# Patient Record
Sex: Female | Born: 1940 | Race: White | Hispanic: No | Smoking: Former smoker
Health system: Southern US, Community
[De-identification: ages and names within clinical notes are randomized; demographics above are authoritative.]

## PROBLEM LIST (undated history)

## (undated) DIAGNOSIS — M8580 Other specified disorders of bone density and structure, unspecified site: Secondary | ICD-10-CM

## (undated) DIAGNOSIS — I10 Essential (primary) hypertension: Secondary | ICD-10-CM

## (undated) DIAGNOSIS — R011 Cardiac murmur, unspecified: Secondary | ICD-10-CM

## (undated) DIAGNOSIS — R569 Unspecified convulsions: Secondary | ICD-10-CM

## (undated) DIAGNOSIS — E039 Hypothyroidism, unspecified: Secondary | ICD-10-CM

## (undated) DIAGNOSIS — F1010 Alcohol abuse, uncomplicated: Secondary | ICD-10-CM

## (undated) DIAGNOSIS — I639 Cerebral infarction, unspecified: Secondary | ICD-10-CM

## (undated) DIAGNOSIS — K8020 Calculus of gallbladder without cholecystitis without obstruction: Secondary | ICD-10-CM

## (undated) DIAGNOSIS — M1990 Unspecified osteoarthritis, unspecified site: Secondary | ICD-10-CM

## (undated) DIAGNOSIS — E785 Hyperlipidemia, unspecified: Secondary | ICD-10-CM

## (undated) DIAGNOSIS — I350 Nonrheumatic aortic (valve) stenosis: Secondary | ICD-10-CM

## (undated) DIAGNOSIS — K635 Polyp of colon: Secondary | ICD-10-CM

## (undated) HISTORY — PX: COLONOSCOPY: SHX174

## (undated) HISTORY — PX: OTHER SURGICAL HISTORY: SHX169

## (undated) HISTORY — DX: Hyperlipidemia, unspecified: E78.5

## (undated) HISTORY — DX: Cerebral infarction, unspecified: I63.9

## (undated) HISTORY — DX: Unspecified convulsions: R56.9

## (undated) HISTORY — DX: Hypothyroidism, unspecified: E03.9

## (undated) HISTORY — DX: Nonrheumatic aortic (valve) stenosis: I35.0

## (undated) HISTORY — DX: Other specified disorders of bone density and structure, unspecified site: M85.80

## (undated) HISTORY — DX: Alcohol abuse, uncomplicated: F10.10

## (undated) HISTORY — DX: Calculus of gallbladder without cholecystitis without obstruction: K80.20

## (undated) HISTORY — DX: Polyp of colon: K63.5

## (undated) HISTORY — DX: Essential (primary) hypertension: I10

---

## 1997-07-11 ENCOUNTER — Other Ambulatory Visit: Admission: RE | Admit: 1997-07-11 | Discharge: 1997-07-11 | Payer: Self-pay | Admitting: Obstetrics & Gynecology

## 1999-06-21 ENCOUNTER — Ambulatory Visit (HOSPITAL_COMMUNITY): Admission: RE | Admit: 1999-06-21 | Discharge: 1999-06-21 | Payer: Self-pay | Admitting: Gastroenterology

## 1999-11-16 ENCOUNTER — Encounter: Payer: Self-pay | Admitting: Family Medicine

## 1999-11-16 ENCOUNTER — Encounter: Admission: RE | Admit: 1999-11-16 | Discharge: 1999-11-16 | Payer: Self-pay | Admitting: Family Medicine

## 2000-11-29 ENCOUNTER — Encounter: Payer: Self-pay | Admitting: Family Medicine

## 2000-11-29 ENCOUNTER — Ambulatory Visit (HOSPITAL_COMMUNITY): Admission: RE | Admit: 2000-11-29 | Discharge: 2000-11-29 | Payer: Self-pay | Admitting: Family Medicine

## 2000-12-21 ENCOUNTER — Other Ambulatory Visit: Admission: RE | Admit: 2000-12-21 | Discharge: 2000-12-21 | Payer: Self-pay | Admitting: Internal Medicine

## 2002-03-20 ENCOUNTER — Other Ambulatory Visit: Admission: RE | Admit: 2002-03-20 | Discharge: 2002-03-20 | Payer: Self-pay | Admitting: Family Medicine

## 2002-04-02 ENCOUNTER — Encounter: Admission: RE | Admit: 2002-04-02 | Discharge: 2002-04-02 | Payer: Self-pay | Admitting: Family Medicine

## 2002-04-02 ENCOUNTER — Encounter: Payer: Self-pay | Admitting: Family Medicine

## 2003-11-11 ENCOUNTER — Other Ambulatory Visit: Admission: RE | Admit: 2003-11-11 | Discharge: 2003-11-11 | Payer: Self-pay | Admitting: Family Medicine

## 2003-11-18 ENCOUNTER — Ambulatory Visit (HOSPITAL_COMMUNITY): Admission: RE | Admit: 2003-11-18 | Discharge: 2003-11-18 | Payer: Self-pay | Admitting: Family Medicine

## 2003-11-21 ENCOUNTER — Ambulatory Visit (HOSPITAL_COMMUNITY): Admission: RE | Admit: 2003-11-21 | Discharge: 2003-11-21 | Payer: Self-pay | Admitting: Family Medicine

## 2005-08-25 ENCOUNTER — Encounter: Admission: RE | Admit: 2005-08-25 | Discharge: 2005-08-25 | Payer: Self-pay | Admitting: Family Medicine

## 2006-01-12 ENCOUNTER — Ambulatory Visit: Admission: RE | Admit: 2006-01-12 | Discharge: 2006-01-12 | Payer: Self-pay | Admitting: Family Medicine

## 2006-01-12 ENCOUNTER — Encounter: Payer: Self-pay | Admitting: Vascular Surgery

## 2007-05-31 ENCOUNTER — Encounter: Admission: RE | Admit: 2007-05-31 | Discharge: 2007-05-31 | Payer: Self-pay | Admitting: Family Medicine

## 2008-06-02 ENCOUNTER — Encounter: Admission: RE | Admit: 2008-06-02 | Discharge: 2008-06-02 | Payer: Self-pay | Admitting: Family Medicine

## 2010-04-25 ENCOUNTER — Encounter: Payer: Self-pay | Admitting: Family Medicine

## 2010-10-07 ENCOUNTER — Other Ambulatory Visit: Payer: Self-pay | Admitting: Family Medicine

## 2010-10-07 DIAGNOSIS — Z1231 Encounter for screening mammogram for malignant neoplasm of breast: Secondary | ICD-10-CM

## 2010-10-07 DIAGNOSIS — M8580 Other specified disorders of bone density and structure, unspecified site: Secondary | ICD-10-CM

## 2010-10-20 ENCOUNTER — Ambulatory Visit
Admission: RE | Admit: 2010-10-20 | Discharge: 2010-10-20 | Disposition: A | Discharge status: Home / Self Care | Payer: 59 | Source: Ambulatory Visit | Attending: Family Medicine | Admitting: Family Medicine

## 2010-10-20 DIAGNOSIS — M8580 Other specified disorders of bone density and structure, unspecified site: Secondary | ICD-10-CM

## 2010-10-20 DIAGNOSIS — Z1231 Encounter for screening mammogram for malignant neoplasm of breast: Secondary | ICD-10-CM

## 2013-10-01 ENCOUNTER — Other Ambulatory Visit: Payer: Self-pay | Admitting: Family Medicine

## 2013-10-01 DIAGNOSIS — M8580 Other specified disorders of bone density and structure, unspecified site: Secondary | ICD-10-CM

## 2013-10-01 DIAGNOSIS — Z1231 Encounter for screening mammogram for malignant neoplasm of breast: Secondary | ICD-10-CM

## 2013-10-03 ENCOUNTER — Ambulatory Visit
Admission: RE | Admit: 2013-10-03 | Discharge: 2013-10-03 | Disposition: A | Discharge status: Home / Self Care | Payer: Medicare Other | Source: Ambulatory Visit | Attending: Family Medicine | Admitting: Family Medicine

## 2013-10-03 DIAGNOSIS — M8580 Other specified disorders of bone density and structure, unspecified site: Secondary | ICD-10-CM

## 2013-10-03 DIAGNOSIS — Z1231 Encounter for screening mammogram for malignant neoplasm of breast: Secondary | ICD-10-CM

## 2015-07-23 ENCOUNTER — Other Ambulatory Visit: Payer: Self-pay | Admitting: Family Medicine

## 2015-07-23 DIAGNOSIS — R5381 Other malaise: Secondary | ICD-10-CM

## 2015-07-29 ENCOUNTER — Other Ambulatory Visit: Payer: Self-pay | Admitting: Family Medicine

## 2015-07-29 DIAGNOSIS — M8580 Other specified disorders of bone density and structure, unspecified site: Secondary | ICD-10-CM

## 2015-07-29 DIAGNOSIS — Z1231 Encounter for screening mammogram for malignant neoplasm of breast: Secondary | ICD-10-CM

## 2015-10-07 ENCOUNTER — Ambulatory Visit
Admission: RE | Admit: 2015-10-07 | Discharge: 2015-10-07 | Disposition: A | Discharge status: Home / Self Care | Payer: Medicare Other | Source: Ambulatory Visit | Attending: Family Medicine | Admitting: Family Medicine

## 2015-10-07 DIAGNOSIS — Z1231 Encounter for screening mammogram for malignant neoplasm of breast: Secondary | ICD-10-CM

## 2015-10-07 DIAGNOSIS — M8580 Other specified disorders of bone density and structure, unspecified site: Secondary | ICD-10-CM

## 2016-09-18 NOTE — H&P (Signed)
TOTAL HIP ADMISSION H&P  Patient is admitted for right total hip arthroplasty, anterior approach.  Subjective:  Chief Complaint:    Right hip primary OA / pain  HPI: Sarah Berg, 76 y.o. female, has a history of pain and functional disability in the right hip(s) due to arthritis and patient has failed non-surgical conservative treatments for greater than 12 weeks to include NSAID's and/or analgesics, use of assistive devices and activity modification.  Onset of symptoms was gradual starting >10 years ago with gradually worsening course since that time.The patient noted no past surgery on the right hip(s).  Patient currently rates pain in the right hip at 5 out of 10 with activity. Patient has worsening of pain with activity and weight bearing, trendelenberg gait, pain that interfers with activities of daily living and pain with passive range of motion. Patient has evidence of periarticular osteophytes and joint space narrowing by imaging studies. This condition presents safety issues increasing the risk of falls.  There is no current active infection.   Risks, benefits and expectations were discussed with the patient.  Risks including but not limited to the risk of anesthesia, blood clots, nerve damage, blood vessel damage, failure of the prosthesis, infection and up to and including death.  Patient understand the risks, benefits and expectations and wishes to proceed with surgery.   PCP: Patient, No Pcp Per  D/C Plans:       Home - Daughter's home  Post-op Meds:       No Rx given  Tranexamic Acid:      To be given - IV   Decadron:      Is to be given  FYI:     ASA  Norco  DME:   Rx given for - RW and 3-n-1  PT:   No PT   Past Medical History:  Diagnosis Date  . Alcohol abuse   . Aortic stenosis   . CVA (cerebrovascular accident)   . Gallstones   . Hyperlipidemia   . Hyperplastic colon polyp   . Hypertension   . Hypothyroidism   . Osteopenia   . Peripheral vascular disease    . Seizures     Past Surgical History:  Procedure Laterality Date  . COLONOSCOPY    . hyperplastic      No prescriptions prior to admission.   Allergies  Allergen Reactions  . Ampicillin     Social History  Substance Use Topics  . Smoking status: Current Every Day Smoker    Packs/day: 1.50    Types: Cigarettes  . Smokeless tobacco: Not on file  . Alcohol use No    Family History  Problem Relation Age of Onset  . Heart disease Mother   . Colon cancer Father   . Esophageal cancer Sister   . Colon cancer Brother   . Colon cancer Paternal Grandfather      Review of Systems  Constitutional: Negative.   HENT: Negative.   Eyes: Negative.   Respiratory: Negative.   Cardiovascular: Negative.   Gastrointestinal: Negative.   Genitourinary: Negative.   Musculoskeletal: Positive for joint pain.  Skin: Negative.   Neurological: Negative.   Endo/Heme/Allergies: Negative.   Psychiatric/Behavioral: Negative.     Objective:  Physical Exam  Constitutional: She is oriented to person, place, and time. She appears well-developed.  HENT:  Head: Normocephalic.  Eyes: Pupils are equal, round, and reactive to light.  Neck: Neck supple. No JVD present. No tracheal deviation present. No thyromegaly present.  Cardiovascular:  Normal rate, regular rhythm and intact distal pulses.   Murmur heard. Respiratory: Effort normal and breath sounds normal. No respiratory distress. She has no wheezes.  GI: Soft. There is no tenderness. There is no guarding.  Musculoskeletal:       Right hip: She exhibits decreased range of motion, decreased strength, tenderness and bony tenderness. She exhibits no swelling, no deformity and no laceration.  Lymphadenopathy:    She has no cervical adenopathy.  Neurological: She is alert and oriented to person, place, and time.  Skin: Skin is warm and dry.  Psychiatric: She has a normal mood and affect.      Imaging Review Plain radiographs demonstrate  severe degenerative joint disease of the right hip(s). The bone quality appears to be good for age and reported activity level.  Assessment/Plan:  End stage arthritis, right hip(s)  The patient history, physical examination, clinical judgement of the provider and imaging studies are consistent with end stage degenerative joint disease of the right hip(s) and total hip arthroplasty is deemed medically necessary. The treatment options including medical management, injection therapy, arthroscopy and arthroplasty were discussed at length. The risks and benefits of total hip arthroplasty were presented and reviewed. The risks due to aseptic loosening, infection, stiffness, dislocation/subluxation,  thromboembolic complications and other imponderables were discussed.  The patient acknowledged the explanation, agreed to proceed with the plan and consent was signed. Patient is being admitted for inpatient treatment for surgery, pain control, PT, OT, prophylactic antibiotics, VTE prophylaxis, progressive ambulation and ADL's and discharge planning.The patient is planning to be discharged home.      Anastasio AuerbachMatthew S. Mihira Tozzi   PA-C  09/18/2016, 10:32 AM

## 2016-09-21 ENCOUNTER — Encounter (HOSPITAL_COMMUNITY): Payer: Self-pay

## 2016-09-21 NOTE — Patient Instructions (Signed)
Kateri PlummerJanice E Soltis  09/21/2016   Your procedure is scheduled on: 09/27/2016    [facility]   Report to admitting at   1030 AM   Call this number if you have problems the morning of surgery  [phone]   Remember: ONLY 1 PERSON MAY GO WITH YOU TO SHORT STAY TO GET  READY MORNING OF YOUR SURGERY.  Do not eat food or drink liquids :After Midnight.     Take these medicines the morning of surgery with A SIP OF WATER:Flonase if neededm, Synthroid                                  You may not have any metal on your body including hair pins and              piercings  Do not wear jewelry, make-up, lotions, powders or perfumes, deodorant             Do not wear nail polish.  Do not shave  48 hours prior to surgery.                Do not bring valuables to the hospital. [facility] IS NOT             RESPONSIBLE   FOR VALUABLES.  Contacts, dentures or bridgework may not be worn into surgery.  Leave suitcase in the car. After surgery it may be brought to your room.                     Please read over the following fact sheets you were given: _____________________________________________________________________             [facility] - Preparing [facility] Before surgery, you can play an important role.  Because skin is not sterile, your skin needs to be as free of germs as possible.  You can reduce the number of germs on your skin by washing with CHG (chlorahexidine gluconate) soap before surgery.  CHG is an antiseptic cleaner which kills germs and bonds with the skin to continue killing germs even after washing. Please DO NOT use if you have an allergy to CHG or antibacterial soaps.  If your skin becomes reddened/irritated stop using the CHG and inform your nurse when you arrive at Short Stay. Do not shave (including legs and underarms) for at least 48 hours prior to the first CHG shower.  You may shave your face/neck. Please follow  these instructions carefully:  1.  Shower with CHG Soap the night before surgery and the  morning of Surgery.  2.  If you choose to wash your hair, wash your hair first as usual with your  normal  shampoo.  3.  After you shampoo, rinse your hair and body thoroughly to remove the  shampoo.                           4.  Use CHG as you would any other liquid soap.  You can apply chg directly  to the skin and wash                       Gently with a scrungie or clean washcloth.  5.  Apply the CHG  Soap to your body ONLY FROM THE NECK DOWN.   Do not use on face/ open                           Wound or open sores. Avoid contact with eyes, ears mouth and genitals (private parts).                       Wash face,  Genitals (private parts) with your normal soap.             6.  Wash thoroughly, paying special attention to the area where your surgery  will be performed.  7.  Thoroughly rinse your body with warm water from the neck down.  8.  DO NOT shower/wash with your normal soap after using and rinsing off  the CHG Soap.                9.  Pat yourself dry with a clean towel.            10.  Wear clean pajamas.            11.  Place clean sheets on your bed the night of your first shower and do not  sleep with pets. Day of Surgery : Do not apply any lotions/deodorants the morning of surgery.  Please wear clean clothes to the hospital/surgery center.  FAILURE TO FOLLOW THESE INSTRUCTIONS MAY RESULT IN THE CANCELLATION OF YOUR SURGERY PATIENT SIGNATURE_________________________________  NURSE SIGNATURE__________________________________  ________________________________________________________________________  WHAT IS A BLOOD TRANSFUSION? Blood Transfusion Information  A transfusion is the replacement of blood or some of its parts. Blood is made up of multiple cells which provide different functions.  Red blood cells carry oxygen and are used for blood loss replacement.  White blood cells fight  against infection.  Platelets control bleeding.  Plasma helps clot blood.  Other blood products are available for specialized needs, such as hemophilia or other clotting disorders. BEFORE THE TRANSFUSION  Who gives blood for transfusions?   Healthy volunteers who are fully evaluated to make sure their blood is safe. This is blood bank blood. Transfusion therapy is the safest it has ever been in the practice of medicine. Before blood is taken from a donor, a complete history is taken to make sure that person has no history of diseases nor engages in risky social behavior (examples are intravenous drug use or sexual activity with multiple partners). The donor's travel history is screened to minimize risk of transmitting infections, such as malaria. The donated blood is tested for signs of infectious diseases, such as HIV and hepatitis. The blood is then tested to be sure it is compatible with you in order to minimize the chance of a transfusion reaction. If you or a relative donates blood, this is often done in anticipation of surgery and is not appropriate for emergency situations. It takes many days to process the donated blood. RISKS AND COMPLICATIONS Although transfusion therapy is very safe and saves many lives, the main dangers of transfusion include:   Getting an infectious disease.  Developing a transfusion reaction. This is an allergic reaction to something in the blood you were given. Every precaution is taken to prevent this. The decision to have a blood transfusion has been considered carefully by your caregiver before blood is given. Blood is not given unless the benefits outweigh the risks. AFTER THE TRANSFUSION  Right after receiving a blood transfusion, you  will usually feel much better and more energetic. This is especially true if your red blood cells have gotten low (anemic). The transfusion raises the level of the red blood cells which carry oxygen, and this usually causes an  energy increase.  The nurse administering the transfusion will monitor you carefully for complications. HOME CARE INSTRUCTIONS  No special instructions are needed after a transfusion. You may find your energy is better. Speak with your caregiver about any limitations on activity for underlying diseases you may have. SEEK MEDICAL CARE IF:   Your condition is not improving after your transfusion.  You develop redness or irritation at the intravenous (IV) site. SEEK IMMEDIATE MEDICAL CARE IF:  Any of the following symptoms occur over the next 12 hours:  Shaking chills.  You have a temperature by mouth above 102 F (38.9 C), not controlled by medicine.  Chest, back, or muscle pain.  People around you feel you are not acting correctly or are confused.  Shortness of breath or difficulty breathing.  Dizziness and fainting.  You get a rash or develop hives.  You have a decrease in urine output.  Your urine turns a dark color or changes to pink, red, or brown. Any of the following symptoms occur over the next 10 days:  You have a temperature by mouth above 102 F (38.9 C), not controlled by medicine.  Shortness of breath.  Weakness after normal activity.  The white part of the eye turns yellow (jaundice).  You have a decrease in the amount of urine or are urinating less often.  Your urine turns a dark color or changes to pink, red, or brown. Document Released: 03/18/2000 Document Revised: 06/13/2011 Document Reviewed: 11/05/2007 ExitCare Patient Information 2014 [place].  _______________________________________________________________________  Incentive Spirometer  An incentive spirometer is a tool that can help keep your lungs clear and active. This tool measures how well you are filling your lungs with each breath. Taking long deep breaths may help reverse or decrease the chance of developing breathing (pulmonary) problems (especially infection) following:  A  long period of time when you are unable to move or be active. BEFORE THE PROCEDURE   If the spirometer includes an indicator to show your best effort, your nurse or respiratory therapist will set it to a desired goal.  If possible, sit up straight or lean slightly forward. Try not to slouch.  Hold the incentive spirometer in an upright position. INSTRUCTIONS FOR USE  1. Sit on the edge of your bed if possible, or sit up as far as you can in bed or on a chair. 2. Hold the incentive spirometer in an upright position. 3. Breathe out normally. 4. Place the mouthpiece in your mouth and seal your lips tightly around it. 5. Breathe in slowly and as deeply as possible, raising the piston or the ball toward the top of the column. 6. Hold your breath for 3-5 seconds or for as long as possible. Allow the piston or ball to fall to the bottom of the column. 7. Remove the mouthpiece from your mouth and breathe out normally. 8. Rest for a few seconds and repeat Steps 1 through 7 at least 10 times every 1-2 hours when you are awake. Take your time and take a few normal breaths between deep breaths. 9. The spirometer may include an indicator to show your best effort. Use the indicator as a goal to work toward during each repetition. 10. After each set of 10 deep breaths, practice  coughing to be sure your lungs are clear. If you have an incision (the cut made at the time of surgery), support your incision when coughing by placing a pillow or rolled up towels firmly against it. Once you are able to get out of bed, walk around indoors and cough well. You may stop using the incentive spirometer when instructed by your caregiver.  RISKS AND COMPLICATIONS  Take your time so you do not get dizzy or light-headed.  If you are in pain, you may need to take or ask for pain medication before doing incentive spirometry. It is harder to take a deep breath if you are having pain. AFTER USE  Rest and breathe slowly and  easily.  It can be helpful to keep track of a log of your progress. Your caregiver can provide you with a simple table to help with this. If you are using the spirometer at home, follow these instructions: [place] IF:   You are having difficultly using the spirometer.  You have trouble using the spirometer as often as instructed.  Your pain medication is not giving enough relief while using the spirometer.  You develop fever of 100.5 F (38.1 C) or higher. SEEK IMMEDIATE MEDICAL CARE IF:   You cough up bloody sputum that had not been present before.  You develop fever of 102 F (38.9 C) or greater.  You develop worsening pain at or near the incision site. MAKE SURE YOU:   Understand these instructions.  Will watch your condition.  Will get help right away if you are not doing well or get worse. Document Released: 08/01/2006 Document Revised: 06/13/2011 Document Reviewed: 10/02/2006 [facility] Patient Information 2014 [place], [place].   ________________________________________________________________________

## 2016-09-22 ENCOUNTER — Encounter (HOSPITAL_COMMUNITY)
Admission: RE | Admit: 2016-09-22 | Discharge: 2016-09-22 | Disposition: A | Discharge status: Home / Self Care | Payer: Medicare Other | Source: Ambulatory Visit | Attending: Orthopedic Surgery | Admitting: Orthopedic Surgery

## 2016-09-22 ENCOUNTER — Encounter (HOSPITAL_COMMUNITY): Payer: Self-pay | Admitting: *Deleted

## 2016-09-22 DIAGNOSIS — M1611 Unilateral primary osteoarthritis, right hip: Secondary | ICD-10-CM | POA: Diagnosis not present

## 2016-09-22 DIAGNOSIS — Z01812 Encounter for preprocedural laboratory examination: Secondary | ICD-10-CM | POA: Insufficient documentation

## 2016-09-22 DIAGNOSIS — Z01810 Encounter for preprocedural cardiovascular examination: Secondary | ICD-10-CM | POA: Diagnosis not present

## 2016-09-22 HISTORY — DX: Unspecified osteoarthritis, unspecified site: M19.90

## 2016-09-22 HISTORY — DX: Cardiac murmur, unspecified: R01.1

## 2016-09-22 LAB — CBC
HEMATOCRIT: 35.6 % — AB (ref 36.0–46.0)
Hemoglobin: 12.1 g/dL (ref 12.0–15.0)
MCH: 30 pg (ref 26.0–34.0)
MCHC: 34 g/dL (ref 30.0–36.0)
MCV: 88.1 fL (ref 78.0–100.0)
Platelets: 283 10*3/uL (ref 150–400)
RBC: 4.04 MIL/uL (ref 3.87–5.11)
RDW: 14.1 % (ref 11.5–15.5)
WBC: 10.2 10*3/uL (ref 4.0–10.5)

## 2016-09-22 LAB — COMPREHENSIVE METABOLIC PANEL
ALT: 14 U/L (ref 14–54)
ANION GAP: 6 (ref 5–15)
AST: 22 U/L (ref 15–41)
Albumin: 4.1 g/dL (ref 3.5–5.0)
Alkaline Phosphatase: 43 U/L (ref 38–126)
BILIRUBIN TOTAL: 0.6 mg/dL (ref 0.3–1.2)
BUN: 15 mg/dL (ref 6–20)
CALCIUM: 9.3 mg/dL (ref 8.9–10.3)
CO2: 28 mmol/L (ref 22–32)
Chloride: 103 mmol/L (ref 101–111)
Creatinine, Ser: 0.76 mg/dL (ref 0.44–1.00)
GFR calc Af Amer: >60 mL/min (ref >60)
Glucose, Bld: 94 mg/dL (ref 65–99)
POTASSIUM: 4.7 mmol/L (ref 3.5–5.1)
Sodium: 137 mmol/L (ref 135–145)
TOTAL PROTEIN: 7.2 g/dL (ref 6.5–8.1)

## 2016-09-22 LAB — SURGICAL PCR SCREEN
MRSA, PCR: NEGATIVE
STAPHYLOCOCCUS AUREUS: NEGATIVE

## 2016-09-22 LAB — ABO/RH: ABO/RH(D): A POS

## 2016-09-22 NOTE — Progress Notes (Signed)
Clearance- 07/20/16 on chart - Dr Tiburcio Peaharris

## 2016-09-26 NOTE — Progress Notes (Signed)
Final EKG done 09/22/16-in epic  

## 2016-09-27 ENCOUNTER — Encounter (HOSPITAL_COMMUNITY): Payer: Self-pay

## 2016-09-27 ENCOUNTER — Inpatient Hospital Stay (HOSPITAL_COMMUNITY): Payer: Medicare Other | Admitting: Anesthesiology

## 2016-09-27 ENCOUNTER — Inpatient Hospital Stay (HOSPITAL_COMMUNITY): Payer: Medicare Other

## 2016-09-27 ENCOUNTER — Encounter (HOSPITAL_COMMUNITY)
Admission: RE | Disposition: A | Discharge status: Home / Self Care | Payer: Self-pay | Source: Ambulatory Visit | Attending: Orthopedic Surgery

## 2016-09-27 ENCOUNTER — Inpatient Hospital Stay (HOSPITAL_COMMUNITY)
Admission: RE | Admit: 2016-09-27 | Discharge: 2016-09-28 | DRG: 470 | Disposition: A | Discharge status: Home / Self Care | Payer: Medicare Other | Source: Ambulatory Visit | Attending: Orthopedic Surgery | Admitting: Orthopedic Surgery

## 2016-09-27 DIAGNOSIS — I10 Essential (primary) hypertension: Secondary | ICD-10-CM | POA: Diagnosis present

## 2016-09-27 DIAGNOSIS — Z79899 Other long term (current) drug therapy: Secondary | ICD-10-CM

## 2016-09-27 DIAGNOSIS — F17210 Nicotine dependence, cigarettes, uncomplicated: Secondary | ICD-10-CM | POA: Diagnosis present

## 2016-09-27 DIAGNOSIS — Z881 Allergy status to other antibiotic agents status: Secondary | ICD-10-CM | POA: Diagnosis not present

## 2016-09-27 DIAGNOSIS — M25551 Pain in right hip: Secondary | ICD-10-CM | POA: Diagnosis present

## 2016-09-27 DIAGNOSIS — Z8673 Personal history of transient ischemic attack (TIA), and cerebral infarction without residual deficits: Secondary | ICD-10-CM

## 2016-09-27 DIAGNOSIS — I350 Nonrheumatic aortic (valve) stenosis: Secondary | ICD-10-CM | POA: Diagnosis present

## 2016-09-27 DIAGNOSIS — E785 Hyperlipidemia, unspecified: Secondary | ICD-10-CM | POA: Diagnosis present

## 2016-09-27 DIAGNOSIS — M1611 Unilateral primary osteoarthritis, right hip: Secondary | ICD-10-CM | POA: Diagnosis present

## 2016-09-27 DIAGNOSIS — Z96641 Presence of right artificial hip joint: Secondary | ICD-10-CM

## 2016-09-27 DIAGNOSIS — Z96649 Presence of unspecified artificial hip joint: Secondary | ICD-10-CM

## 2016-09-27 HISTORY — PX: TOTAL HIP ARTHROPLASTY: SHX124

## 2016-09-27 LAB — TYPE AND SCREEN
ABO/RH(D): A POS
ANTIBODY SCREEN: NEGATIVE

## 2016-09-27 SURGERY — ARTHROPLASTY, HIP, TOTAL, ANTERIOR APPROACH
Anesthesia: Spinal | Site: Hip | Laterality: Right

## 2016-09-27 MED ORDER — BISACODYL 10 MG RE SUPP: 10.0000 mg | Freq: Every day | RECTAL | Status: DC | PRN

## 2016-09-27 MED ORDER — LACTATED RINGERS IV SOLN
INTRAVENOUS | Status: DC
  Administered 2016-09-27: 1000 mL via INTRAVENOUS
  Administered 2016-09-27 (×2): via INTRAVENOUS

## 2016-09-27 MED ORDER — MEPERIDINE HCL 50 MG/ML IJ SOLN: 6.2500 mg | INTRAMUSCULAR | Status: DC | PRN

## 2016-09-27 MED ORDER — ONDANSETRON HCL 4 MG/2ML IJ SOLN: 4.0000 mg | Freq: Four times a day (QID) | INTRAMUSCULAR | Status: DC | PRN

## 2016-09-27 MED ORDER — FERROUS SULFATE 325 (65 FE) MG PO TABS: 325.0000 mg | ORAL_TABLET | Freq: Three times a day (TID) | ORAL | Status: DC

## 2016-09-27 MED ORDER — LIDOCAINE 2% (20 MG/ML) 5 ML SYRINGE
INTRAMUSCULAR | Status: DC | PRN
  Administered 2016-09-27: 20 mg via INTRAVENOUS

## 2016-09-27 MED ORDER — METHOCARBAMOL 1000 MG/10ML IJ SOLN
500.0000 mg | Freq: Four times a day (QID) | INTRAVENOUS | Status: DC | PRN
  Filled 2016-09-27: qty 5

## 2016-09-27 MED ORDER — MIDAZOLAM HCL 2 MG/2ML IJ SOLN
INTRAMUSCULAR | Status: DC | PRN
  Administered 2016-09-27 (×2): 1 mg via INTRAVENOUS

## 2016-09-27 MED ORDER — CEFAZOLIN SODIUM-DEXTROSE 2-4 GM/100ML-% IV SOLN
2.0000 g | INTRAVENOUS | Status: AC
  Administered 2016-09-27: 2 g via INTRAVENOUS

## 2016-09-27 MED ORDER — FLUTICASONE PROPIONATE 50 MCG/ACT NA SUSP
1.0000 | Freq: Every day | NASAL | Status: DC | PRN
  Filled 2016-09-27: qty 16

## 2016-09-27 MED ORDER — ALUM & MAG HYDROXIDE-SIMETH 200-200-20 MG/5ML PO SUSP: 15.0000 mL | ORAL | Status: DC | PRN

## 2016-09-27 MED ORDER — ONDANSETRON HCL 4 MG/2ML IJ SOLN
INTRAMUSCULAR | Status: AC
  Filled 2016-09-27: qty 2

## 2016-09-27 MED ORDER — PROPOFOL 500 MG/50ML IV EMUL
INTRAVENOUS | Status: DC | PRN
  Administered 2016-09-27: 50 ug/kg/min via INTRAVENOUS

## 2016-09-27 MED ORDER — FENTANYL CITRATE (PF) 100 MCG/2ML IJ SOLN
INTRAMUSCULAR | Status: DC | PRN
  Administered 2016-09-27 (×2): 50 ug via INTRAVENOUS

## 2016-09-27 MED ORDER — OXYCODONE HCL 5 MG PO TABS: 5.0000 mg | ORAL_TABLET | Freq: Once | ORAL | Status: DC | PRN

## 2016-09-27 MED ORDER — PROMETHAZINE HCL 25 MG/ML IJ SOLN: 6.2500 mg | INTRAMUSCULAR | Status: DC | PRN

## 2016-09-27 MED ORDER — CEFAZOLIN SODIUM-DEXTROSE 2-4 GM/100ML-% IV SOLN
INTRAVENOUS | Status: AC
  Filled 2016-09-27: qty 100

## 2016-09-27 MED ORDER — DEXAMETHASONE SODIUM PHOSPHATE 10 MG/ML IJ SOLN
INTRAMUSCULAR | Status: AC
  Filled 2016-09-27: qty 1

## 2016-09-27 MED ORDER — PROPOFOL 10 MG/ML IV BOLUS
INTRAVENOUS | Status: AC
  Filled 2016-09-27: qty 40

## 2016-09-27 MED ORDER — DEXAMETHASONE SODIUM PHOSPHATE 10 MG/ML IJ SOLN
10.0000 mg | Freq: Once | INTRAMUSCULAR | Status: AC
  Administered 2016-09-27: 10 mg via INTRAVENOUS

## 2016-09-27 MED ORDER — CHLORHEXIDINE GLUCONATE 4 % EX LIQD: 60.0000 mL | Freq: Once | CUTANEOUS | Status: DC

## 2016-09-27 MED ORDER — DIPHENHYDRAMINE HCL 25 MG PO CAPS
25.0000 mg | ORAL_CAPSULE | Freq: Four times a day (QID) | ORAL | Status: DC | PRN
  Filled 2016-09-27: qty 1

## 2016-09-27 MED ORDER — DOCUSATE SODIUM 100 MG PO CAPS
100.0000 mg | ORAL_CAPSULE | Freq: Two times a day (BID) | ORAL | Status: DC
  Administered 2016-09-27 – 2016-09-28 (×2): 100 mg via ORAL
  Filled 2016-09-27 (×2): qty 1

## 2016-09-27 MED ORDER — DEXAMETHASONE SODIUM PHOSPHATE 10 MG/ML IJ SOLN: 10.0000 mg | Freq: Once | INTRAMUSCULAR | Status: DC

## 2016-09-27 MED ORDER — PROPOFOL 10 MG/ML IV BOLUS
INTRAVENOUS | Status: AC
  Filled 2016-09-27: qty 20

## 2016-09-27 MED ORDER — OXYCODONE HCL 5 MG/5ML PO SOLN
5.0000 mg | Freq: Once | ORAL | Status: DC | PRN
  Filled 2016-09-27: qty 5

## 2016-09-27 MED ORDER — CELECOXIB 200 MG PO CAPS
200.0000 mg | ORAL_CAPSULE | Freq: Two times a day (BID) | ORAL | Status: DC
  Administered 2016-09-27 – 2016-09-28 (×2): 200 mg via ORAL
  Filled 2016-09-27 (×2): qty 1

## 2016-09-27 MED ORDER — SODIUM CHLORIDE 0.9 % IV SOLN
INTRAVENOUS | Status: DC
  Administered 2016-09-28: via INTRAVENOUS

## 2016-09-27 MED ORDER — CEFAZOLIN SODIUM-DEXTROSE 2-4 GM/100ML-% IV SOLN
2.0000 g | Freq: Four times a day (QID) | INTRAVENOUS | Status: AC
  Administered 2016-09-27 – 2016-09-28 (×2): 2 g via INTRAVENOUS
  Filled 2016-09-27 (×2): qty 100

## 2016-09-27 MED ORDER — FENTANYL CITRATE (PF) 100 MCG/2ML IJ SOLN: 25.0000 ug | INTRAMUSCULAR | Status: DC | PRN

## 2016-09-27 MED ORDER — PROPOFOL 10 MG/ML IV BOLUS
INTRAVENOUS | Status: DC | PRN
  Administered 2016-09-27 (×3): 20 mg via INTRAVENOUS

## 2016-09-27 MED ORDER — MENTHOL 3 MG MT LOZG: 1.0000 | LOZENGE | OROMUCOSAL | Status: DC | PRN

## 2016-09-27 MED ORDER — SODIUM CHLORIDE 0.9 % IR SOLN
Status: DC | PRN
  Administered 2016-09-27: 1000 mL

## 2016-09-27 MED ORDER — MAGNESIUM CITRATE PO SOLN: 1.0000 | Freq: Once | ORAL | Status: DC | PRN

## 2016-09-27 MED ORDER — METHOCARBAMOL 500 MG PO TABS
500.0000 mg | ORAL_TABLET | Freq: Four times a day (QID) | ORAL | Status: DC | PRN
  Administered 2016-09-28: 500 mg via ORAL
  Filled 2016-09-27: qty 1

## 2016-09-27 MED ORDER — FENTANYL CITRATE (PF) 100 MCG/2ML IJ SOLN
INTRAMUSCULAR | Status: AC
  Filled 2016-09-27: qty 2

## 2016-09-27 MED ORDER — TRANEXAMIC ACID 1000 MG/10ML IV SOLN
1000.0000 mg | Freq: Once | INTRAVENOUS | Status: AC
  Administered 2016-09-27: 1000 mg via INTRAVENOUS
  Filled 2016-09-27: qty 1100

## 2016-09-27 MED ORDER — TRANEXAMIC ACID 1000 MG/10ML IV SOLN
1000.0000 mg | INTRAVENOUS | Status: AC
  Administered 2016-09-27: 1000 mg via INTRAVENOUS
  Filled 2016-09-27: qty 1100

## 2016-09-27 MED ORDER — ONDANSETRON HCL 4 MG/2ML IJ SOLN
INTRAMUSCULAR | Status: DC | PRN
  Administered 2016-09-27: 4 mg via INTRAVENOUS

## 2016-09-27 MED ORDER — METOCLOPRAMIDE HCL 5 MG/ML IJ SOLN: 5.0000 mg | Freq: Three times a day (TID) | INTRAMUSCULAR | Status: DC | PRN

## 2016-09-27 MED ORDER — BUPIVACAINE IN DEXTROSE 0.75-8.25 % IT SOLN
INTRATHECAL | Status: DC | PRN
  Administered 2016-09-27: 1.5 mL via INTRATHECAL

## 2016-09-27 MED ORDER — METOCLOPRAMIDE HCL 5 MG PO TABS: 5.0000 mg | ORAL_TABLET | Freq: Three times a day (TID) | ORAL | Status: DC | PRN

## 2016-09-27 MED ORDER — HYDROMORPHONE HCL 1 MG/ML IJ SOLN: 0.2500 mg | INTRAMUSCULAR | Status: DC | PRN

## 2016-09-27 MED ORDER — LEVOTHYROXINE SODIUM 50 MCG PO TABS
50.0000 ug | ORAL_TABLET | Freq: Every day | ORAL | Status: DC
  Administered 2016-09-28: 50 ug via ORAL
  Filled 2016-09-27: qty 1

## 2016-09-27 MED ORDER — EPHEDRINE SULFATE-NACL 50-0.9 MG/10ML-% IV SOSY
PREFILLED_SYRINGE | INTRAVENOUS | Status: DC | PRN
  Administered 2016-09-27 (×5): 10 mg via INTRAVENOUS

## 2016-09-27 MED ORDER — BISOPROLOL-HYDROCHLOROTHIAZIDE 2.5-6.25 MG PO TABS
1.0000 | ORAL_TABLET | Freq: Every day | ORAL | Status: DC
  Filled 2016-09-27 (×2): qty 1

## 2016-09-27 MED ORDER — ONDANSETRON HCL 4 MG PO TABS: 4.0000 mg | ORAL_TABLET | Freq: Four times a day (QID) | ORAL | Status: DC | PRN

## 2016-09-27 MED ORDER — MIDAZOLAM HCL 2 MG/2ML IJ SOLN
INTRAMUSCULAR | Status: AC
  Filled 2016-09-27: qty 2

## 2016-09-27 MED ORDER — ASPIRIN 81 MG PO CHEW
81.0000 mg | CHEWABLE_TABLET | Freq: Two times a day (BID) | ORAL | Status: DC
  Administered 2016-09-27 – 2016-09-28 (×2): 81 mg via ORAL
  Filled 2016-09-27 (×2): qty 1

## 2016-09-27 MED ORDER — PHENOL 1.4 % MT LIQD
1.0000 | OROMUCOSAL | Status: DC | PRN
  Filled 2016-09-27: qty 177

## 2016-09-27 MED ORDER — HYDROCODONE-ACETAMINOPHEN 7.5-325 MG PO TABS
1.0000 | ORAL_TABLET | ORAL | Status: DC
  Administered 2016-09-27 – 2016-09-28 (×4): 1 via ORAL
  Filled 2016-09-27 (×4): qty 1

## 2016-09-27 MED ORDER — POLYETHYLENE GLYCOL 3350 17 G PO PACK: 17.0000 g | PACK | Freq: Two times a day (BID) | ORAL | Status: DC

## 2016-09-27 MED ORDER — EPHEDRINE 5 MG/ML INJ
INTRAVENOUS | Status: AC
  Filled 2016-09-27: qty 10

## 2016-09-27 SURGICAL SUPPLY — 36 items
ADH SKN CLS APL DERMABOND .7 (GAUZE/BANDAGES/DRESSINGS) ×1
BAG DECANTER FOR FLEXI CONT (MISCELLANEOUS) IMPLANT
BAG ZIPLOCK 12X15 (MISCELLANEOUS) IMPLANT
BLADE SAG 18X100X1.27 (BLADE) ×3 IMPLANT
CAPT HIP TOTAL 2 ×3 IMPLANT
CLOTH BEACON ORANGE TIMEOUT ST (SAFETY) ×3 IMPLANT
COVER PERINEAL POST (MISCELLANEOUS) ×3 IMPLANT
COVER SURGICAL LIGHT HANDLE (MISCELLANEOUS) ×3 IMPLANT
DERMABOND ADVANCED (GAUZE/BANDAGES/DRESSINGS) ×2
DERMABOND ADVANCED .7 DNX12 (GAUZE/BANDAGES/DRESSINGS) ×1 IMPLANT
DRAPE STERI IOBAN 125X83 (DRAPES) ×3 IMPLANT
DRAPE U-SHAPE 47X51 STRL (DRAPES) ×6 IMPLANT
DRESSING AQUACEL AG SP 3.5X10 (GAUZE/BANDAGES/DRESSINGS) ×1 IMPLANT
DRSG AQUACEL AG SP 3.5X10 (GAUZE/BANDAGES/DRESSINGS) ×3
DURAPREP 26ML APPLICATOR (WOUND CARE) ×3 IMPLANT
ELECT REM PT RETURN 15FT ADLT (MISCELLANEOUS) ×3 IMPLANT
GLOVE BIOGEL M STRL SZ7.5 (GLOVE) ×6 IMPLANT
GLOVE BIOGEL PI IND STRL 7.5 (GLOVE) ×9 IMPLANT
GLOVE BIOGEL PI IND STRL 8.5 (GLOVE) ×1 IMPLANT
GLOVE BIOGEL PI INDICATOR 7.5 (GLOVE) ×18
GLOVE BIOGEL PI INDICATOR 8.5 (GLOVE) ×2
GLOVE ECLIPSE 8.0 STRL XLNG CF (GLOVE) ×3 IMPLANT
GLOVE ORTHO TXT STRL SZ7.5 (GLOVE) ×3 IMPLANT
GOWN STRL REUS W/TWL LRG LVL3 (GOWN DISPOSABLE) ×6 IMPLANT
GOWN STRL REUS W/TWL XL LVL3 (GOWN DISPOSABLE) ×9 IMPLANT
HOLDER FOLEY CATH W/STRAP (MISCELLANEOUS) ×3 IMPLANT
PACK ANTERIOR HIP CUSTOM (KITS) ×3 IMPLANT
SUT MNCRL AB 4-0 PS2 18 (SUTURE) ×3 IMPLANT
SUT STRATAFIX 0 PDS 27 VIOLET (SUTURE) ×3
SUT VIC AB 1 CT1 36 (SUTURE) ×9 IMPLANT
SUT VIC AB 2-0 CT1 27 (SUTURE) ×6
SUT VIC AB 2-0 CT1 TAPERPNT 27 (SUTURE) ×2 IMPLANT
SUTURE STRATFX 0 PDS 27 VIOLET (SUTURE) ×1 IMPLANT
TRAY FOLEY W/METER SILVER 16FR (SET/KITS/TRAYS/PACK) IMPLANT
WATER STERILE IRR 1500ML POUR (IV SOLUTION) ×3 IMPLANT
YANKAUER SUCT BULB TIP 10FT TU (MISCELLANEOUS) IMPLANT

## 2016-09-27 NOTE — Interval H&P Note (Signed)
History and Physical Interval Note:  09/27/2016 11:20 AM  Sarah PlummerJanice E Hillmann  has presented today for surgery, with the diagnosis of Right hip osteoarthritis  The various methods of treatment have been discussed with the patient and family. After consideration of risks, benefits and other options for treatment, the patient has consented to  Procedure(s) with comments: RIGHT TOTAL HIP ARTHROPLASTY ANTERIOR APPROACH (Right) - 70 mins as a surgical intervention .  The patient's history has been reviewed, patient examined, no change in status, stable for surgery.  I have reviewed the patient's chart and labs.  Questions were answered to the patient's satisfaction.     Shelda PalLIN,Rafay Dahan D

## 2016-09-27 NOTE — Discharge Instructions (Signed)

## 2016-09-27 NOTE — Op Note (Signed)
NAME:  Sarah Berg                ACCOUNT NO.: [id number]      MEDICAL RECORD NO.: [id number]      [facility]      PHYSICIAN:  Durene RomansLIN,Tiffinie Caillier D  DATE OF BIRTH:  1941/04/04     DATE OF PROCEDURE:  09/27/2016                                 OPERATIVE REPORT         PREOPERATIVE DIAGNOSIS: Right  hip osteoarthritis.      POSTOPERATIVE DIAGNOSIS:  Right hip osteoarthritis.      PROCEDURE:  Right total hip replacement through an anterior approach   utilizing DePuy THR system, component size 50mm pinnacle cup, a size 32+4 neutral   Altrex liner, a size 3 Hi Tri Lock stem with a 32+1 delta ceramic   ball.      SURGEON:  Madlyn FrankelMatthew D. Charlann Boxerlin, M.D.      ASSISTANT:  Lanney GinsMatthew Babish, PA-C     ANESTHESIA:  Spinal.      SPECIMENS:  None.      COMPLICATIONS:  None.      BLOOD LOSS:  250 cc     DRAINS:  None.      INDICATION OF THE PROCEDURE:  Sarah Berg is a 76 y.o. female who had   presented to office for evaluation of right hip pain.  Radiographs revealed   progressive degenerative changes with bone-on-bone   articulation to the  hip joint.  The patient had painful limited range of   motion significantly affecting their overall quality of life.  The patient was failing to    respond to conservative measures, and at this point was ready   to proceed with more definitive measures.  The patient has noted progressive   degenerative changes in his hip, progressive problems and dysfunction   with regarding the hip prior to surgery.  Consent was obtained for   benefit of pain relief.  Specific risk of infection, DVT, component   failure, dislocation, need for revision surgery, as well discussion of   the anterior versus posterior approach were reviewed.  Consent was   obtained for benefit of anterior pain relief through an anterior   approach.      PROCEDURE IN DETAIL:  The patient was brought to operative theater.   Once adequate anesthesia,  preoperative antibiotics, 1 gm of Vancomycin, 1 gm of Tranexamic Acid, and 10 mg of Decaron administered.   The patient was positioned supine on the OSI Hanna table.  Once adequate   padding of boney process was carried out, we had predraped out the hip, and  used fluoroscopy to confirm orientation of the pelvis and position.      The right hip was then prepped and draped from proximal iliac crest to   mid thigh with shower curtain technique.      Time-out was performed identifying the patient, planned procedure, and   extremity.     An incision was then made 2 cm distal and lateral to the   anterior superior iliac spine extending over the orientation of the   tensor fascia lata muscle and sharp dissection was carried down to the   fascia of the muscle and protractor placed in the soft tissues.      The fascia  was then incised.  The muscle belly was identified and swept   laterally and retractor placed along the superior neck.  Following   cauterization of the circumflex vessels and removing some pericapsular   fat, a second cobra retractor was placed on the inferior neck.  A third   retractor was placed on the anterior acetabulum after elevating the   anterior rectus.  A L-capsulotomy was along the line of the   superior neck to the trochanteric fossa, then extended proximally and   distally.  Tag sutures were placed and the retractors were then placed   intracapsular.  We then identified the trochanteric fossa and   orientation of my neck cut, confirmed this radiographically   and then made a neck osteotomy with the femur on traction.  The femoral   head was removed without difficulty or complication.  Traction was let   off and retractors were placed posterior and anterior around the   acetabulum.      The labrum and foveal tissue were debrided.  I began reaming with a 44mm   reamer and reamed up to 49mm reamer with good bony bed preparation and a 50mm   cup was chosen.  The final  50mm Pinnacle cup was then impacted under fluoroscopy  to confirm the depth of penetration and orientation with respect to   abduction.  A screw was placed followed by the hole eliminator.  The final   32+4 neutral Altrex liner was impacted with good visualized rim fit.  The cup was positioned anatomically within the acetabular portion of the pelvis.      At this point, the femur was rolled at 80 degrees.  Further capsule was   released off the inferior aspect of the femoral neck.  I then   released the superior capsule proximally.  The hook was placed laterally   along the femur and elevated manually and held in position with the bed   hook.  The leg was then extended and adducted with the leg rolled to 100   degrees of external rotation.  Once the proximal femur was fully   exposed, I used a box osteotome to set orientation.  I then began   broaching with the starting chili pepper broach and passed this by hand and then broached up to 3.  With the 3 broach in place I chose a high offset neck and did several trial reductions.  The offset was appropriate, leg lengths   appeared to be equal best matched with the +1 head ball, confirmed radiographically.   Given these findings, I went ahead and dislocated the hip, repositioned all   retractors and positioned the right hip in the extended and abducted position.  The final 3 Hi Tri Lock stem was   chosen and it was impacted down to the level of neck cut.  Based on this   and the trial reduction, a 32+1 delta ceramic ball was chosen and   impacted onto a clean and dry trunnion, and the hip was reduced.  The   hip had been irrigated throughout the case again at this point.  I did   reapproximate the superior capsular leaflet to the anterior leaflet   using #1 Vicryl.  The fascia of the   tensor fascia lata muscle was then reapproximated using #1 Vicryl.  The   remaining wound was closed with 2-0 Vicryl and running 4-0 Monocryl.   The hip was  cleaned, dried, and dressed sterilely using  Dermabond and   Aquacel dressing.  She was then brought   to recovery room in stable condition tolerating the procedure well.    Lanney Gins, PA-C was present for the entirety of the case involved from   preoperative positioning, perioperative retractor management, general   facilitation of the case, as well as primary wound closure as assistant.            Madlyn Frankel Charlann Boxer, M.D.        09/27/2016 1:27 PM

## 2016-09-27 NOTE — Anesthesia Procedure Notes (Signed)
Spinal  Patient location during procedure: OR Staffing Anesthesiologist: Nolon Nations Performed: anesthesiologist  Preanesthetic Checklist Completed: patient identified, site marked, surgical consent, pre-op evaluation, timeout performed, IV checked, risks and benefits discussed and monitors and equipment checked Spinal Block Patient position: sitting Prep: DuraPrep Patient monitoring: heart rate, continuous pulse ox and blood pressure Approach: right paramedian Location: L3-4 Injection technique: single-shot Needle Needle type: Sprotte  Needle gauge: 24 G Needle length: 9 cm Additional Notes Expiration date of kit checked and confirmed. Patient tolerated procedure well, without complications.

## 2016-09-27 NOTE — Anesthesia Postprocedure Evaluation (Signed)
Anesthesia Post Note  Patient: Sarah Berg  Procedure(s) Performed: Procedure(s) (LRB): RIGHT TOTAL HIP ARTHROPLASTY ANTERIOR APPROACH (Right)     Patient location during evaluation: PACU Anesthesia Type: Spinal and MAC Level of consciousness: awake and alert Pain management: pain level controlled Vital Signs Assessment: post-procedure vital signs reviewed and stable Respiratory status: spontaneous breathing and respiratory function stable Cardiovascular status: blood pressure returned to baseline and stable Postop Assessment: spinal receding Anesthetic complications: no    Last Vitals:  Vitals:   09/27/16 1736 09/27/16 1830  BP: (!) 123/45 (!) 122/51  Pulse: 79 93  Resp: 14 14  Temp: 36.4 C 36.5 C    Last Pain:  Vitals:   09/27/16 1830  TempSrc: Axillary  PainSc:                  Nolon Nations

## 2016-09-27 NOTE — Transfer of Care (Signed)
Immediate Anesthesia Transfer of Care Note  Patient: Sarah PlummerJanice E Berg  Procedure(s) Performed: Procedure(s) with comments: RIGHT TOTAL HIP ARTHROPLASTY ANTERIOR APPROACH (Right) - 70 mins  Patient Location: PACU  Anesthesia Type:Spinal  Level of Consciousness: awake and alert   Airway & Oxygen Therapy: Patient Spontanous Breathing and Patient connected to face mask oxygen  Post-op Assessment: Report given to RN and Post -op Vital signs reviewed and stable  Post vital signs: Reviewed and stable  Last Vitals:  Vitals:   09/27/16 1100  BP: (!) 146/69  Pulse: 84  Resp: 16  Temp: 37.1 C    Last Pain:  Vitals:   09/27/16 1100  TempSrc: Oral      Patients Stated Pain Goal: 4 (09/27/16 1107)  Complications: No apparent anesthesia complications

## 2016-09-27 NOTE — Anesthesia Preprocedure Evaluation (Addendum)
Anesthesia Evaluation  Patient identified by MRN, date of birth, ID band Patient awake    Reviewed: Allergy & Precautions, NPO status , Patient's Chart, lab work & pertinent test results  Airway Mallampati: II  TM Distance: >3 FB Neck ROM: Full    Dental no notable dental hx. (+) Dental Advisory Given   Pulmonary neg pulmonary ROS, former smoker,    Pulmonary exam normal breath sounds clear to auscultation       Cardiovascular hypertension, negative cardio ROS  + Valvular Problems/Murmurs AS  Rhythm:Regular Rate:Normal + Systolic murmurs    Neuro/Psych CVA, No Residual Symptoms negative psych ROS   GI/Hepatic negative GI ROS, Neg liver ROS,   Endo/Other  negative endocrine ROSHypothyroidism   Renal/GU negative Renal ROS     Musculoskeletal negative musculoskeletal ROS (+) Arthritis ,   Abdominal   Peds  Hematology negative hematology ROS (+)   Anesthesia Other Findings   Reproductive/Obstetrics negative OB ROS                           Anesthesia Physical Anesthesia Plan  ASA: III  Anesthesia Plan: Spinal   Post-op Pain Management:    Induction:   PONV Risk Score and Plan: 3 and Ondansetron, Dexamethasone, Propofol and Treatment may vary due to age or medical condition  Airway Management Planned:   Additional Equipment:   Intra-op Plan:   Post-operative Plan:   Informed Consent: I have reviewed the patients History and Physical, chart, labs and discussed the procedure including the risks, benefits and alternatives for the proposed anesthesia with the patient or authorized representative who has indicated his/her understanding and acceptance.   Dental advisory given  Plan Discussed with: CRNA  Anesthesia Plan Comments: (History of AS on chart but no quantification of severity. SEM heard, II/VI in severity. Patient denies DOE, orthopnea, PND, peripheral edema. Discussed with  Dr. Tiburcio PeaHarris her PCP. Murmur heard on exam in 2005, TTE done that showed aortic sclerosis but NO stenosis. Echocardiogram has not been repeated since. She denies signs and symptoms of heart failure. I believe she is low to moderate risk for perioperative cardiac complications. )     Anesthesia Quick Evaluation

## 2016-09-28 ENCOUNTER — Encounter (HOSPITAL_COMMUNITY): Payer: Self-pay | Admitting: Orthopedic Surgery

## 2016-09-28 LAB — BASIC METABOLIC PANEL
ANION GAP: 7 (ref 5–15)
BUN: 12 mg/dL (ref 6–20)
CALCIUM: 8.6 mg/dL — AB (ref 8.9–10.3)
CO2: 25 mmol/L (ref 22–32)
Chloride: 106 mmol/L (ref 101–111)
Creatinine, Ser: 0.7 mg/dL (ref 0.44–1.00)
GFR calc Af Amer: >60 mL/min (ref >60)
GFR calc non Af Amer: >60 mL/min (ref >60)
GLUCOSE: 172 mg/dL — AB (ref 65–99)
Potassium: 4.2 mmol/L (ref 3.5–5.1)
Sodium: 138 mmol/L (ref 135–145)

## 2016-09-28 LAB — CBC
HEMATOCRIT: 28.1 % — AB (ref 36.0–46.0)
Hemoglobin: 9.6 g/dL — ABNORMAL LOW (ref 12.0–15.0)
MCH: 29.8 pg (ref 26.0–34.0)
MCHC: 34.2 g/dL (ref 30.0–36.0)
MCV: 87.3 fL (ref 78.0–100.0)
Platelets: 264 10*3/uL (ref 150–400)
RBC: 3.22 MIL/uL — ABNORMAL LOW (ref 3.87–5.11)
RDW: 13.9 % (ref 11.5–15.5)
WBC: 17.6 10*3/uL — AB (ref 4.0–10.5)

## 2016-09-28 MED ORDER — FERROUS SULFATE 325 (65 FE) MG PO TABS: 325.0000 mg | ORAL_TABLET | Freq: Three times a day (TID) | ORAL | 3 refills | Status: AC

## 2016-09-28 MED ORDER — DOCUSATE SODIUM 100 MG PO CAPS: 100.0000 mg | ORAL_CAPSULE | Freq: Two times a day (BID) | ORAL | 0 refills | Status: AC

## 2016-09-28 MED ORDER — METHOCARBAMOL 500 MG PO TABS: 500.0000 mg | ORAL_TABLET | Freq: Four times a day (QID) | ORAL | 0 refills | Status: AC | PRN

## 2016-09-28 MED ORDER — HYDROCODONE-ACETAMINOPHEN 7.5-325 MG PO TABS: 1.0000 | ORAL_TABLET | ORAL | 0 refills | Status: AC | PRN

## 2016-09-28 MED ORDER — ASPIRIN 81 MG PO CHEW: 81.0000 mg | CHEWABLE_TABLET | Freq: Two times a day (BID) | ORAL | 0 refills | Status: AC

## 2016-09-28 MED ORDER — POLYETHYLENE GLYCOL 3350 17 G PO PACK: 17.0000 g | PACK | Freq: Two times a day (BID) | ORAL | 0 refills | Status: AC

## 2016-09-28 NOTE — Evaluation (Addendum)
Physical Therapy Evaluation Patient Details Name: Sarah Berg MRN: [id number] DOB: 1940-11-28 Today's Date: 09/28/2016   History of Present Illness  76 yo female s/p R THA-DA 09/27/16. Hx of osteopenia, PVD, Sz, ETOH abuse, CVA, aortic stenosis  Clinical Impression  On eval, pt was supervision-min guard assist for mobility. She walked ~135 feet with a RW and climbed 2 steps. Minimal pain with activity. Educated pt on PWB status. Reviewed/practiced gait training, stair training, and exercises. Instructed pt to use RW for ambulation until MD updates her WB status. Issued HEP for pt to perform 2x/day until she follows up with MD. Discouraged pt from resuming most of her yoga exercises until she receives clearance from MD. Educated pt on avoiding any extremes of ROM and to avoid getting on floor. All education completed. Pt plans to d/c to her daughter's for a couple of weeks. Okay to d/c from PT standpoint-made RN aware.    Follow Up Recommendations D/c plan and therapy as arranged by surgeon;Supervision - Intermittent    Equipment Recommendations  Rolling walker with 5" wheels    Recommendations for Other Services       Precautions / Restrictions Precautions Precautions: Fall Restrictions Weight Bearing Restrictions: Yes RLE Weight Bearing: Partial weight bearing RLE Partial Weight Bearing Percentage or Pounds: 50% Other Position/Activity Restrictions: PWB 50%      Mobility  Bed Mobility Overal bed mobility: Needs Assistance Bed Mobility: Supine to Sit     Supine to sit: Supervision     General bed mobility comments: oob in recliner  Transfers Overall transfer level: Needs assistance Equipment used: Rolling walker (2 wheeled) Transfers: Sit to/from Stand Sit to Stand: Supervision         General transfer comment: for safety.   Ambulation/Gait Ambulation/Gait assistance: Min guard Ambulation Distance (Feet): 135 Feet Assistive device: Rolling walker (2  wheeled) Gait Pattern/deviations: Step-to pattern;Step-through pattern;Decreased stride length     General Gait Details: close guard for safety. Cues for adherence to PWB status.   Stairs Stairs: Yes Stairs assistance: Min guard Stair Management: One rail Right;Forwards;With cane Number of Stairs: 5 General stair comments: Up and over portable steps x 2. Cues for safety, technique, sequence, adherence to PWB status.   Wheelchair Mobility    Modified Rankin (Stroke Patients Only)       Balance                                             Pertinent Vitals/Pain Pain Assessment: 0-10 Pain Score: 3  Pain Location: R hip Pain Descriptors / Indicators: Sore Pain Intervention(s): Monitored during session;Repositioned;Ice applied    Home Living Family/patient expects to be discharged to:: Private residence Living Arrangements: Alone Available Help at Discharge: Family (daughters) Type of Home: House       Home Layout: Two level;Able to live on main level with bedroom/bathroom Home Equipment: Gilmer MorCane - single point Additional Comments: will stay with daughter for a couple of weeks    Prior Function Level of Independence: Independent with assistive device(s)         Comments: had difficulty with sock/shoe     Hand Dominance        Extremity/Trunk Assessment   Upper Extremity Assessment Upper Extremity Assessment: Defer to OT evaluation    Lower Extremity Assessment Lower Extremity Assessment: Generalized weakness (s/p R THA-DA)    Cervical / Trunk  Assessment Cervical / Trunk Assessment: Normal  Communication   Communication: No difficulties  Cognition Arousal/Alertness: Awake/alert Behavior During Therapy: Impulsive Overall Cognitive Status: No family/caregiver present to determine baseline cognitive functioning                                 General Comments: pt needed multimodal cues at times both for safety and for  following cues      General Comments      Exercises Total Joint Exercises Ankle Circles/Pumps: AROM;Both;10 reps;Seated Quad Sets: AROM;Both;Seated;10 reps Heel Slides: AROM;Right;10 reps;Supine Hip ABduction/ADduction: AROM;Right;10 reps;Supine Long Arc Quad: AROM;Right;10 reps;Seated Knee Flexion: AROM;Right;10 reps;Standing Marching in Standing: AROM;Right;10 reps;Standing (R LE only-no alternating)   Assessment/Plan    PT Assessment Patent does not need any further PT services  PT Problem List         PT Treatment Interventions      PT Goals (Current goals can be found in the Care Plan section)  Acute Rehab PT Goals Patient Stated Goal: home PT Goal Formulation: All assessment and education complete, DC therapy    Frequency     Barriers to discharge        Co-evaluation               AM-PAC PT "6 Clicks" Daily Activity  Outcome Measure Difficulty turning over in bed (including adjusting bedclothes, sheets and blankets)?: A Little Difficulty moving from lying on back to sitting on the side of the bed? : A Little Difficulty sitting down on and standing up from a chair with arms (e.g., wheelchair, bedside commode, etc,.)?: A Little Help needed moving to and from a bed to chair (including a wheelchair)?: A Little Help needed walking in hospital room?: A Little Help needed climbing 3-5 steps with a railing? : A Little 6 Click Score: 18    End of Session Equipment Utilized During Treatment: Gait belt Activity Tolerance: Patient tolerated treatment well Patient left: in chair;with call bell/phone within reach   PT Visit Diagnosis: Muscle weakness (generalized) (M62.81);Difficulty in walking, not elsewhere classified (R26.2)    Time: 6295-2841 PT Time Calculation (min) (ACUTE ONLY): 20 min   Charges:   PT Evaluation $PT Eval Low Complexity: 1 Procedure     PT G Codes:          Rebeca Alert, MPT Pager: [phone]

## 2016-09-28 NOTE — Evaluation (Signed)
Occupational Therapy Evaluation Patient Details Name: Sarah Berg MRN: [id number] DOB: 1940-06-20 Today's Date: 09/28/2016    History of Present Illness s/p R DA THA   Clinical Impression   This 76 year old female was admitted for the above sx. She is overall min guard for adls at this time.  Will follow in acute to increase safety during ADLs.      Follow Up Recommendations  Supervision/Assistance - 24 hour    Equipment Recommendations   (pt does not want 3:1; may benefit for shower)    Recommendations for Other Services       Precautions / Restrictions Precautions Precautions: Fall Restrictions Weight Bearing Restrictions: Yes Other Position/Activity Restrictions: PWB 50%      Mobility Bed Mobility Overal bed mobility: Needs Assistance Bed Mobility: Supine to Sit     Supine to sit: Supervision     General bed mobility comments: cues for sequence  Transfers Overall transfer level: Needs assistance Equipment used: Rolling walker (2 wheeled) Transfers: Sit to/from Stand Sit to Stand: Min guard         General transfer comment: cues for safety, UE/LE placement    Balance                                           ADL either performed or assessed with clinical judgement   ADL Overall ADL's : Needs assistance/impaired     Grooming: Oral care;Supervision/safety;Standing   Upper Body Bathing: Set up;Sitting   Lower Body Bathing: Minimal assistance;Sit to/from stand   Upper Body Dressing : Set up;Sitting   Lower Body Dressing: Minimal assistance;Sit to/from stand   Toilet Transfer: Min guard;Ambulation;BSC;RW   Toileting- Architect and Hygiene: Min guard;Sit to/from stand   Tub/ Shower Transfer: Walk-in shower;Min guard;Ambulation     General ADL Comments: performed ADL and bathroom transfers.  Cues for safety; pt is impulsive.  Pt unsteady but self corrected balance twice during session     Vision          Perception     Praxis      Pertinent Vitals/Pain Pain Assessment: 0-10 Pain Score: 2  Pain Location: R hip Pain Descriptors / Indicators: Sore Pain Intervention(s): Limited activity within patient's tolerance;Monitored during session;Premedicated before session;Repositioned;Ice applied     Hand Dominance     Extremity/Trunk Assessment Upper Extremity Assessment Upper Extremity Assessment: Overall WFL for tasks assessed           Communication Communication Communication: No difficulties   Cognition Arousal/Alertness: Awake/alert Behavior During Therapy: Impulsive Overall Cognitive Status: No family/caregiver present to determine baseline cognitive functioning                                 General Comments: pt needed multimodal cues at times both for safety and for following cues   General Comments       Exercises     Shoulder Instructions      Home Living Family/patient expects to be discharged to:: Private residence Living Arrangements: Alone                 Bathroom Shower/Tub: Producer, television/film/video: Standard         Additional Comments: will stay with daughter for a couple of weeks      Prior Functioning/Environment Level  of Independence: Independent with assistive device(s)        Comments: had difficulty with sock/shoe        OT Problem List: Decreased safety awareness;Decreased knowledge of use of DME or AE;Decreased activity tolerance;Pain      OT Treatment/Interventions: Self-care/ADL training;DME and/or AE instruction;Patient/family education;Balance training    OT Goals(Current goals can be found in the care plan section) Acute Rehab OT Goals Patient Stated Goal: home OT Goal Formulation: With patient Time For Goal Achievement: 09/29/16 Potential to Achieve Goals: Good ADL Goals Additional ADL Goal #1: Pt will not need any cues for safety during adls and bathroom transfers  OT Frequency: Min  2X/week   Barriers to D/C:            Co-evaluation              AM-PAC PT "6 Clicks" Daily Activity     Outcome Measure Help from another person eating meals?: None Help from another person taking care of personal grooming?: A Little Help from another person toileting, which includes using toliet, bedpan, or urinal?: A Little Help from another person bathing (including washing, rinsing, drying)?: A Little Help from another person to put on and taking off regular upper body clothing?: A Little Help from another person to put on and taking off regular lower body clothing?: A Little 6 Click Score: 19   End of Session    Activity Tolerance: Patient tolerated treatment well Patient left: in chair;with call bell/phone within reach;with chair alarm set  OT Visit Diagnosis: Unsteadiness on feet (R26.81);Pain Pain - Right/Left: Right Pain - part of body: Hip                Time: [id number] OT Time Calculation (min): 31 min Charges:  OT General Charges $OT Visit: 1 Procedure OT Evaluation $OT Eval Low Complexity: 1 Procedure OT Treatments $Self Care/Home Management : 8-22 mins G-Codes:     [name], OTR/L [phone] 09/28/2016  [name] 09/28/2016, 9:57 AM

## 2016-09-28 NOTE — Care Management Note (Signed)
Case Management Note  Patient Details  Name: Kateri PlummerJanice E Vandevoorde MRN: [id number] Date of Birth: 11/12/1940  Subjective/Objective:   S/P right THA                 [facility]   Expected Discharge Date:  09/28/16               Expected Discharge Plan:  Home w Home Health Services  In-House Referral:     Discharge planning Services  CM Consult  Post Acute Care Choice:    Choice offered to:  Patient  DME Arranged:  Walker rolling DME Agency:  [facility]  [facility]  [facility]  Status of Service:  Completed, signed off  If discussed at [employer] Length of Stay Meetings, dates discussed:    Additional CommentsGeni Bers:  Chevon Fomby, RN 09/28/2016, 11:47 AM

## 2016-09-28 NOTE — Progress Notes (Signed)
     Subjective: 1 Day Post-Op Procedure(s) (LRB): RIGHT TOTAL HIP ARTHROPLASTY ANTERIOR APPROACH (Right)   Patient reports pain as mild, pain controlled. No events throughout the night. Already moving her leg around well.  Ready to be discharged home, to her daughter's house.   Objective:   VITALS:   Vitals:   09/28/16 0130 09/28/16 0500  BP: (!) 121/48 (!) 125/45  Pulse: 95 83  Resp: 15 16  Temp: 98.2 F (36.8 C) 98 F (36.7 C)    Dorsiflexion/Plantar flexion intact Incision: dressing C/D/I No cellulitis present Compartment soft  LABS  Recent Labs  09/28/16 0430  HGB 9.6*  HCT 28.1*  WBC 17.6*  PLT 264     Recent Labs  09/28/16 0430  NA 138  K 4.2  BUN 12  CREATININE 0.70  GLUCOSE 172*     Assessment/Plan: 1 Day Post-Op Procedure(s) (LRB): RIGHT TOTAL HIP ARTHROPLASTY ANTERIOR APPROACH (Right) Foley cath d/c'ed Advance diet Up with therapy D/C IV fluids Discharge home [facility] Follow up with OLIN,Terrall Bley D in 2 weeks.  Contact information:  [facility] [address], [address] [place] [id number] [phone]        Anastasio AuerbachMatthew S. Phelix Fudala   PAC  09/28/2016, 9:25 AM

## 2016-10-06 NOTE — Discharge Summary (Signed)
Physician Discharge Summary  Patient ID: Sarah Berg MRN: [id number] DOB/AGE: [birth year] 76 y.o.  Admit date: 09/27/2016 Discharge date: 09/28/2016   Procedures:  Procedure(s) (LRB): RIGHT TOTAL HIP ARTHROPLASTY ANTERIOR APPROACH (Right)  Attending Physician:  Dr. Durene RomansMatthew Olin   Admission Diagnoses:   Right hip primary OA / pain  Discharge Diagnoses:  Principal Problem:   S/P right THA, AA  Past Medical History:  Diagnosis Date  . Alcohol abuse   . Aortic stenosis   . Arthritis   . CVA (cerebrovascular accident) (HCC)    no deficits   . Gallstones   . Heart murmur   . Hyperlipidemia   . Hyperplastic colon polyp   . Hypertension   . Hypothyroidism   . Osteopenia   . Seizures (HCC)    years ago     HPI:    Sarah Berg, 76 y.o. female, has a history of pain and functional disability in the right hip(s) due to arthritis and patient has failed non-surgical conservative treatments for greater than 12 weeks to include NSAID's and/or analgesics, use of assistive devices and activity modification.  Onset of symptoms was gradual starting >10 years ago with gradually worsening course since that time.The patient noted no past surgery on the right hip(s).  Patient currently rates pain in the right hip at 5 out of 10 with activity. Patient has worsening of pain with activity and weight bearing, trendelenberg gait, pain that interfers with activities of daily living and pain with passive range of motion. Patient has evidence of periarticular osteophytes and joint space narrowing by imaging studies. This condition presents safety issues increasing the risk of falls. There is no current active infection.   Risks, benefits and expectations were discussed with the patient.  Risks including but not limited to the risk of anesthesia, blood clots, nerve damage, blood vessel damage, failure of the prosthesis, infection and up to and including death.  Patient understand the risks, benefits and  expectations and wishes to proceed with surgery.   PCP: Johny BlamerHarris, William, MD   Discharged Condition: good  Hospital Course:  Patient underwent the above stated procedure on 09/27/2016. Patient tolerated the procedure well and brought to the recovery room in good condition and subsequently to the floor.  POD #1 BP: 125/45 ; Pulse: 83 ; Temp: 98 F (36.7 C) ; Resp: 16 Patient reports pain as mild, pain controlled. No events throughout the night. Already moving her leg around well.  Ready to be discharged home, to her daughter's house.  Dorsiflexion/plantar flexion intact, incision: dressing C/D/I, no cellulitis present and compartment soft.   LABS  Basename    HGB     9.6  HCT     28.1    Discharge Exam: General appearance: alert, cooperative and no distress Extremities: Homans sign is negative, no sign of DVT, no edema, redness or tenderness in the calves or thighs and no ulcers, gangrene or trophic changes  Disposition: Home with follow up in 2 weeks   Follow-up Information    Durene Romanslin, Nidya Bouyer, MD. Schedule an appointment as soon as possible for a visit in 2 week(s).   Specialty:  Orthopedic Surgery Contact information: [address] [place] [id number] [phone]           Discharge Instructions    Call MD / Call 911    Complete by:  As directed    If you experience chest pain or shortness of breath, CALL 911 and be transported to  the hospital emergency room.  If you develope a fever above 101 F, pus (white drainage) or increased drainage or redness at the wound, or calf pain, call your surgeon's office.   Change dressing    Complete by:  As directed    Maintain surgical dressing until follow up in the clinic. If the edges start to pull up, may reinforce with tape. If the dressing is no longer working, may remove and cover with gauze and tape, but must keep the area dry and clean.  Call with any questions or concerns.   Constipation Prevention     Complete by:  As directed    Drink plenty of fluids.  Prune juice may be helpful.  You may use a stool softener, such as Colace (over the counter) 100 mg twice a day.  Use MiraLax (over the counter) for constipation as needed.   Diet - low sodium heart healthy    Complete by:  As directed    Discharge instructions    Complete by:  As directed    Maintain surgical dressing until follow up in the clinic. If the edges start to pull up, may reinforce with tape. If the dressing is no longer working, may remove and cover with gauze and tape, but must keep the area dry and clean.  Follow up in 2 weeks at [facility]. Call with any questions or concerns.   Increase activity slowly as tolerated    Complete by:  As directed    Weight bearing as tolerated with assist device (walker, cane, etc) as directed, use it as long as suggested by your surgeon or therapist, typically at least 4-6 weeks.   TED hose    Complete by:  As directed    Use stockings (TED hose) for 2 weeks on both leg(s).  You may remove them at night for sleeping.      Allergies as of 09/28/2016      Reactions   Ampicillin Nausea Only   Disoriented  Has patient had a PCN reaction causing immediate rash, facial/tongue/throat swelling, SOB or lightheadedness with hypotension: No Has patient had a PCN reaction causing severe rash involving mucus membranes or skin necrosis: No Has patient had a PCN reaction that required hospitalization: No Has patient had a PCN reaction occurring within the last 10 years: Yes If all of the above answers are "NO", then may proceed with Cephalosporin use.      Medication List    STOP taking these medications   aspirin 81 MG tablet Replaced by:  aspirin 81 MG chewable tablet     TAKE these medications   aspirin 81 MG chewable tablet Chew 1 tablet (81 mg total) by mouth 2 (two) times daily. Take for 4 weeks, then resume regular dose. Replaces:  aspirin 81 MG tablet     bisoprolol-hydrochlorothiazide 2.5-6.25 MG tablet Commonly known as:  ZIAC Take 1 tablet by mouth daily. Patient takes in the pm per patient   Co Q 10 100 MG Caps Take 100 mg by mouth daily.   docusate sodium 100 MG capsule Commonly known as:  COLACE Take 1 capsule (100 mg total) by mouth 2 (two) times daily.   ferrous sulfate 325 (65 FE) MG tablet Take 1 tablet (325 mg total) by mouth 3 (three) times daily after meals.   FISH OIL PO Take 1 capsule by mouth 2 (two) times a week.   fluticasone 50 MCG/ACT nasal spray Commonly known as:  FLONASE Place 1-2 sprays into  both nostrils daily as needed for allergies or rhinitis.   HYDROcodone-acetaminophen 7.5-325 MG tablet Commonly known as:  NORCO Take 1-2 tablets by mouth every 4 (four) hours as needed for moderate pain.   levothyroxine 50 MCG tablet Commonly known as:  SYNTHROID, LEVOTHROID Take 50 mcg by mouth daily before breakfast.   Magnesium 250 MG Tabs Take 250 mg by mouth daily.   Magnesium 500 MG Tabs Take 500 mg by mouth daily as needed (constipation).   Melatonin 3 MG Tabs Take 3 mg by mouth at bedtime.   methocarbamol 500 MG tablet Commonly known as:  ROBAXIN Take 1 tablet (500 mg total) by mouth every 6 (six) hours as needed for muscle spasms.   multivitamin with minerals Tabs tablet Take 1 tablet by mouth 3 (three) times a week.   polyethylene glycol packet Commonly known as:  MIRALAX / GLYCOLAX Take 17 g by mouth 2 (two) times daily.   Vitamin D 2000 units tablet Take 2,000 Units by mouth daily.        Signed: Anastasio Auerbach. Veera Stapleton   PA-C  10/06/2016, 8:52 AM

## 2017-07-19 ENCOUNTER — Other Ambulatory Visit: Payer: Self-pay | Admitting: Family Medicine

## 2017-07-19 DIAGNOSIS — M8580 Other specified disorders of bone density and structure, unspecified site: Secondary | ICD-10-CM

## 2017-07-20 ENCOUNTER — Other Ambulatory Visit: Payer: Self-pay | Admitting: Family Medicine

## 2017-07-20 DIAGNOSIS — Z1231 Encounter for screening mammogram for malignant neoplasm of breast: Secondary | ICD-10-CM

## 2017-10-11 ENCOUNTER — Ambulatory Visit: Payer: Medicare Other

## 2017-10-11 ENCOUNTER — Inpatient Hospital Stay
Admission: RE | Admit: 2017-10-11 | Discharge: 2017-10-11 | Disposition: A | Discharge status: Home / Self Care | Payer: Medicare Other | Source: Ambulatory Visit | Attending: Family Medicine | Admitting: Family Medicine

## 2017-11-22 ENCOUNTER — Ambulatory Visit
Admission: RE | Admit: 2017-11-22 | Discharge: 2017-11-22 | Disposition: A | Discharge status: Home / Self Care | Payer: Medicare Other | Source: Ambulatory Visit | Attending: Family Medicine | Admitting: Family Medicine

## 2017-11-22 DIAGNOSIS — M8580 Other specified disorders of bone density and structure, unspecified site: Secondary | ICD-10-CM

## 2017-11-22 DIAGNOSIS — Z1231 Encounter for screening mammogram for malignant neoplasm of breast: Secondary | ICD-10-CM

## 2018-11-24 IMAGING — MG DIGITAL SCREENING BILATERAL MAMMOGRAM WITH TOMO AND CAD
8 series · 8 of 24 positions shown · non-contrast
Comparison: Previous exam(s).

CLINICAL DATA: Screening.

EXAM:
DIGITAL SCREENING BILATERAL MAMMOGRAM WITH TOMO AND CAD

[R CC synth-2D]
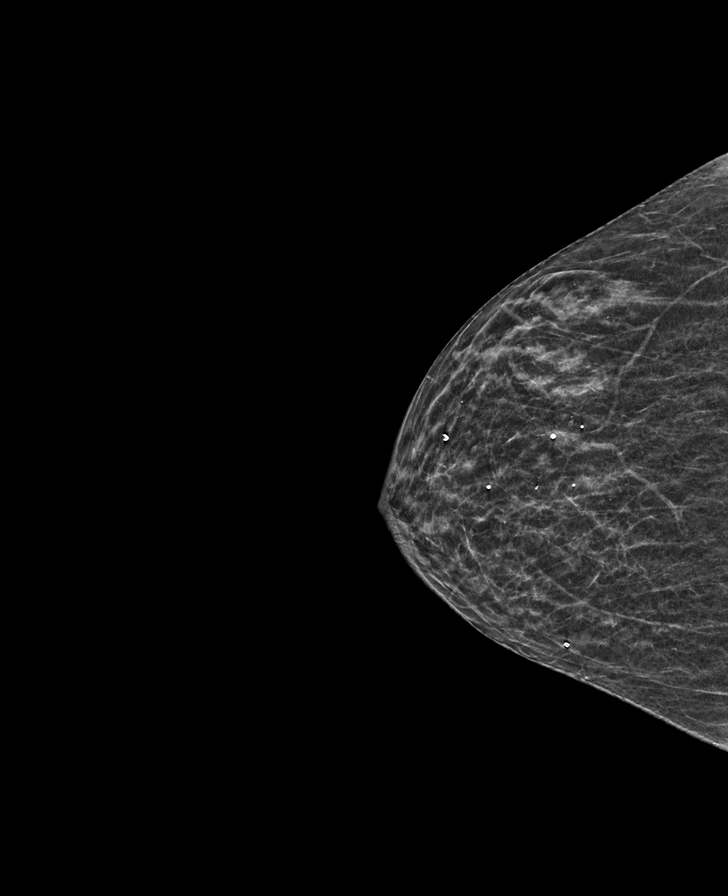

[L CC synth-2D]
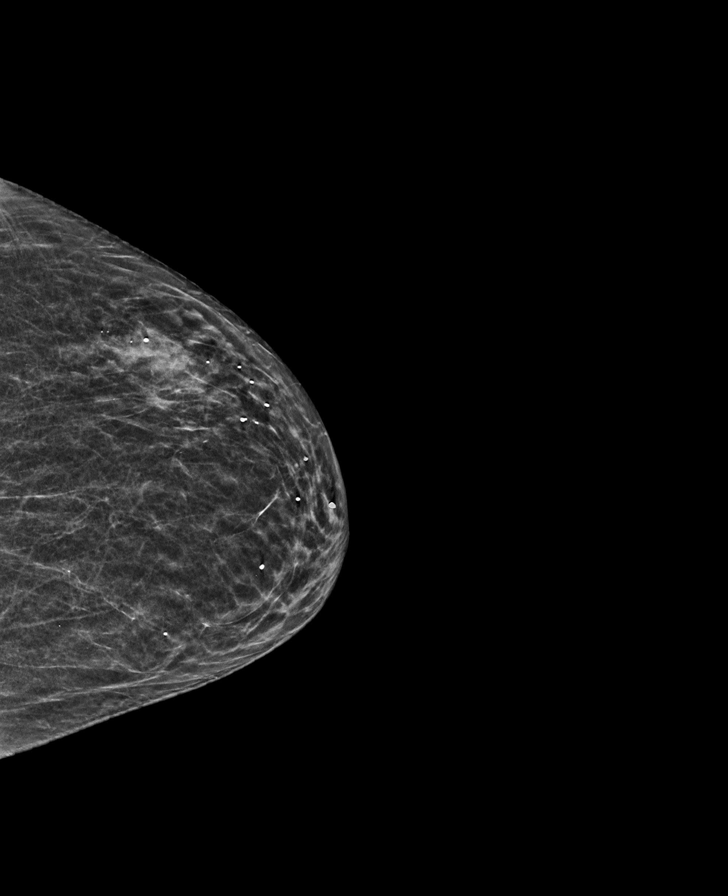

[L MLO synth-2D]
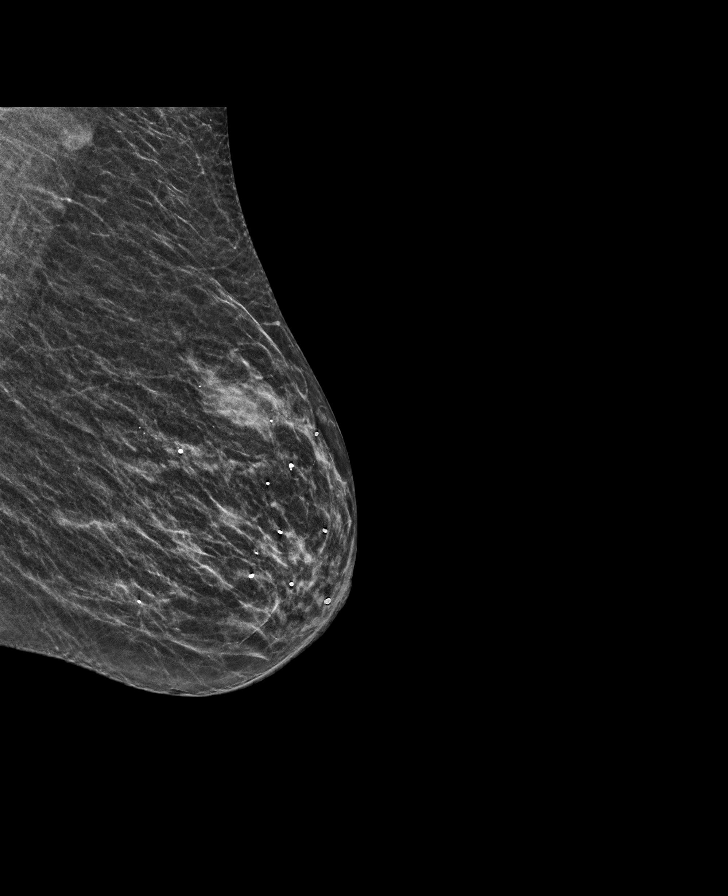

[R MLO synth-2D]
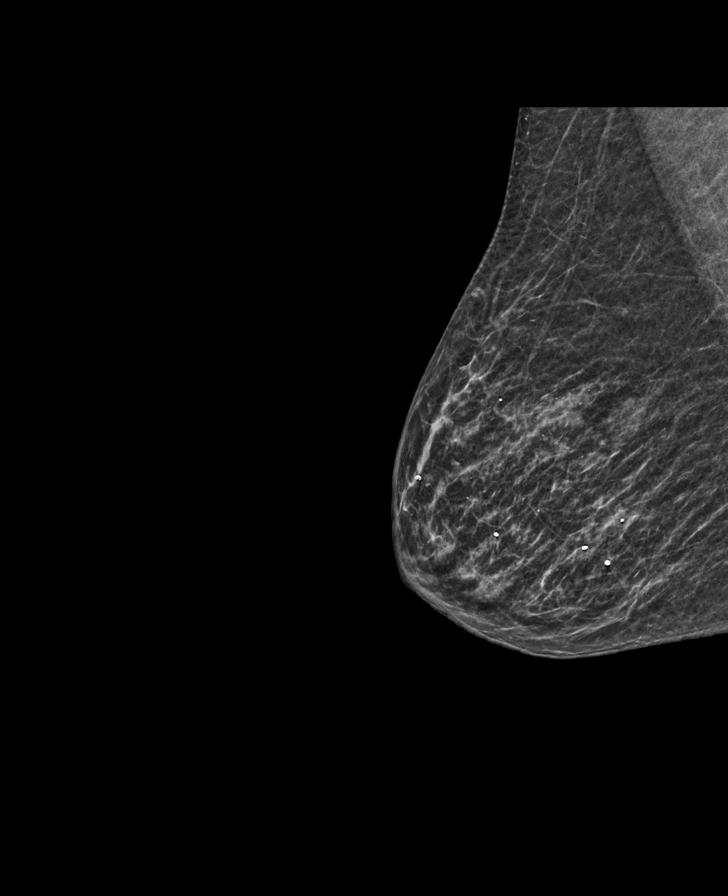

[R MLO tomo · tomo slice 22/43.0]
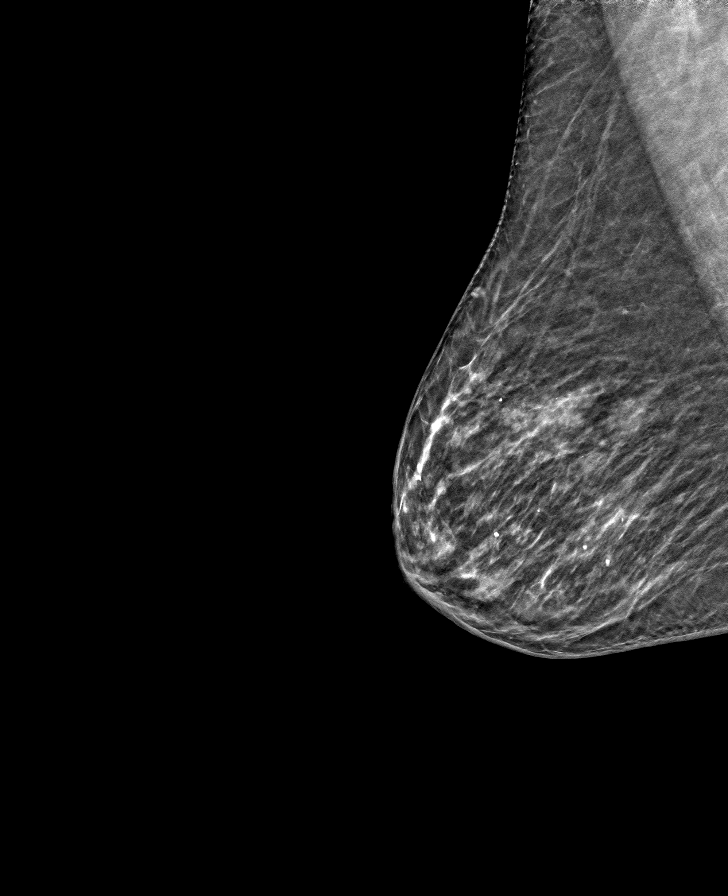

[L MLO tomo · tomo slice 23/46.0]
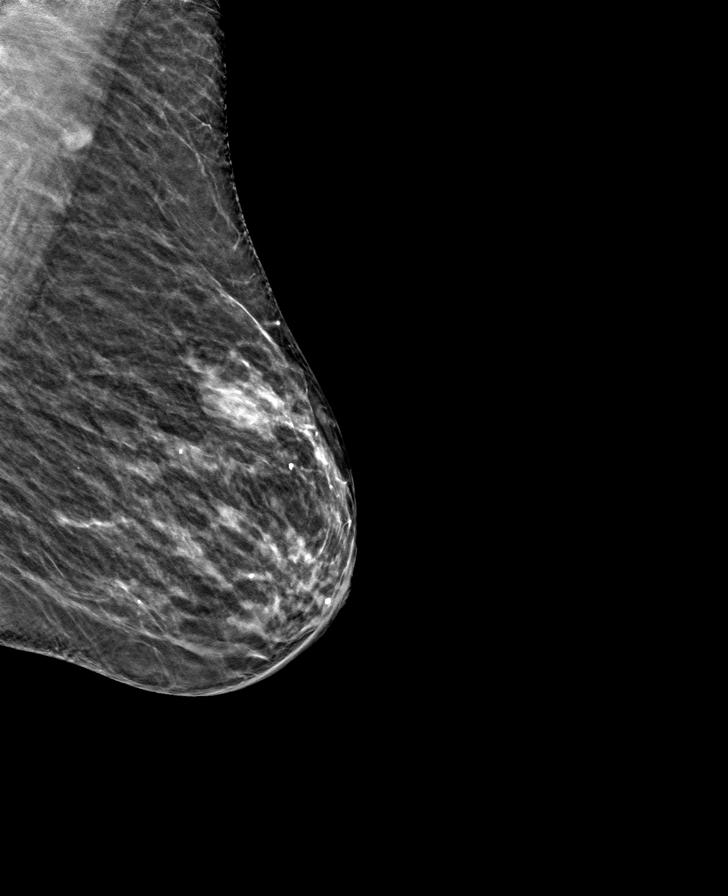

[L CC tomo · tomo slice 24/47.0]
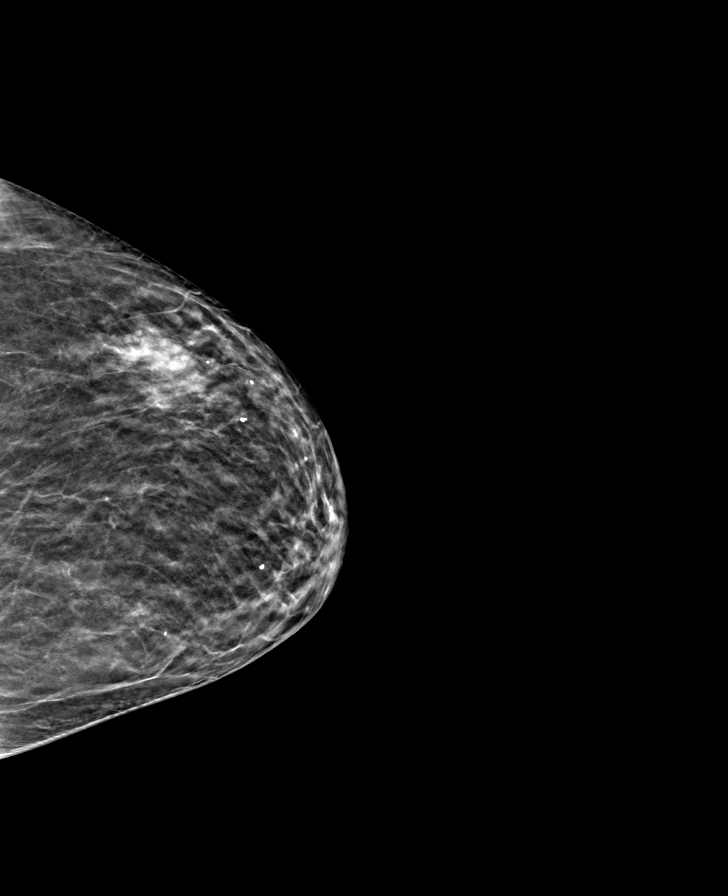

[R CC tomo · tomo slice 20/39.0]
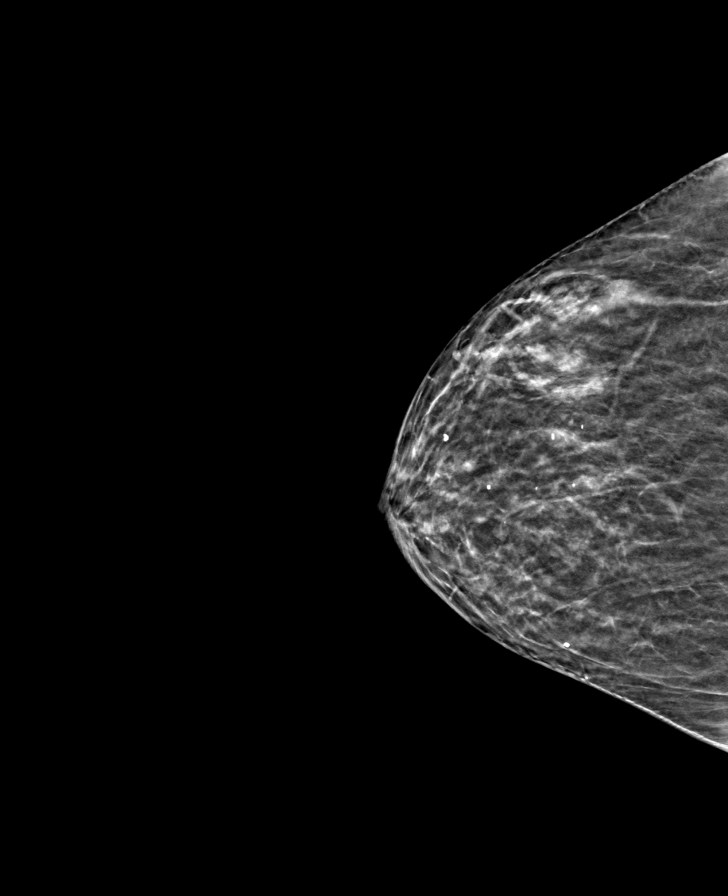

[8 of 24 positions shown; findings below may reference images not displayed]

ACR Breast Density Category b: There are scattered areas of
fibroglandular density.
FINDINGS: There are no findings suspicious for malignancy. Images were
processed with CAD.
IMPRESSION: No mammographic evidence of malignancy. A result letter of this
screening mammogram will be mailed directly to the patient.

RECOMMENDATION:
Screening mammogram in one year. (Code:[id number])

BI-RADS CATEGORY  1: Negative.

## 2020-03-20 ENCOUNTER — Other Ambulatory Visit: Payer: Self-pay | Admitting: Family Medicine

## 2020-03-20 DIAGNOSIS — R5381 Other malaise: Secondary | ICD-10-CM

## 2020-07-24 DIAGNOSIS — E039 Hypothyroidism, unspecified: Secondary | ICD-10-CM | POA: Diagnosis not present

## 2020-07-24 DIAGNOSIS — E7800 Pure hypercholesterolemia, unspecified: Secondary | ICD-10-CM | POA: Diagnosis not present

## 2020-07-24 DIAGNOSIS — R739 Hyperglycemia, unspecified: Secondary | ICD-10-CM | POA: Diagnosis not present

## 2020-07-24 DIAGNOSIS — I10 Essential (primary) hypertension: Secondary | ICD-10-CM | POA: Diagnosis not present

## 2021-01-28 DIAGNOSIS — Z0000 Encounter for general adult medical examination without abnormal findings: Secondary | ICD-10-CM | POA: Diagnosis not present

## 2021-01-28 DIAGNOSIS — E039 Hypothyroidism, unspecified: Secondary | ICD-10-CM | POA: Diagnosis not present

## 2021-01-28 DIAGNOSIS — R739 Hyperglycemia, unspecified: Secondary | ICD-10-CM | POA: Diagnosis not present

## 2021-01-28 DIAGNOSIS — I10 Essential (primary) hypertension: Secondary | ICD-10-CM | POA: Diagnosis not present

## 2021-01-28 DIAGNOSIS — E7800 Pure hypercholesterolemia, unspecified: Secondary | ICD-10-CM | POA: Diagnosis not present

## 2021-01-28 DIAGNOSIS — R8281 Pyuria: Secondary | ICD-10-CM | POA: Diagnosis not present

## 2021-07-23 DIAGNOSIS — E039 Hypothyroidism, unspecified: Secondary | ICD-10-CM | POA: Diagnosis not present

## 2021-07-23 DIAGNOSIS — E7800 Pure hypercholesterolemia, unspecified: Secondary | ICD-10-CM | POA: Diagnosis not present

## 2021-07-23 DIAGNOSIS — I10 Essential (primary) hypertension: Secondary | ICD-10-CM | POA: Diagnosis not present

## 2021-07-23 DIAGNOSIS — R7303 Prediabetes: Secondary | ICD-10-CM | POA: Diagnosis not present

## 2022-02-01 DIAGNOSIS — E7800 Pure hypercholesterolemia, unspecified: Secondary | ICD-10-CM | POA: Diagnosis not present

## 2022-02-01 DIAGNOSIS — I10 Essential (primary) hypertension: Secondary | ICD-10-CM | POA: Diagnosis not present

## 2022-02-01 DIAGNOSIS — Z23 Encounter for immunization: Secondary | ICD-10-CM | POA: Diagnosis not present

## 2022-02-01 DIAGNOSIS — R7303 Prediabetes: Secondary | ICD-10-CM | POA: Diagnosis not present

## 2022-02-01 DIAGNOSIS — Z0000 Encounter for general adult medical examination without abnormal findings: Secondary | ICD-10-CM | POA: Diagnosis not present

## 2022-02-01 DIAGNOSIS — E039 Hypothyroidism, unspecified: Secondary | ICD-10-CM | POA: Diagnosis not present

## 2022-02-01 DIAGNOSIS — H6123 Impacted cerumen, bilateral: Secondary | ICD-10-CM | POA: Diagnosis not present

## 2022-08-04 DIAGNOSIS — R7303 Prediabetes: Secondary | ICD-10-CM | POA: Diagnosis not present

## 2022-08-04 DIAGNOSIS — I739 Peripheral vascular disease, unspecified: Secondary | ICD-10-CM | POA: Diagnosis not present

## 2022-08-04 DIAGNOSIS — R7309 Other abnormal glucose: Secondary | ICD-10-CM | POA: Diagnosis not present

## 2022-08-04 DIAGNOSIS — E7800 Pure hypercholesterolemia, unspecified: Secondary | ICD-10-CM | POA: Diagnosis not present

## 2022-08-04 DIAGNOSIS — I10 Essential (primary) hypertension: Secondary | ICD-10-CM | POA: Diagnosis not present

## 2023-02-10 DIAGNOSIS — R7303 Prediabetes: Secondary | ICD-10-CM | POA: Diagnosis not present

## 2023-02-10 DIAGNOSIS — I739 Peripheral vascular disease, unspecified: Secondary | ICD-10-CM | POA: Diagnosis not present

## 2023-02-10 DIAGNOSIS — G2581 Restless legs syndrome: Secondary | ICD-10-CM | POA: Diagnosis not present

## 2023-02-10 DIAGNOSIS — E7800 Pure hypercholesterolemia, unspecified: Secondary | ICD-10-CM | POA: Diagnosis not present

## 2023-02-10 DIAGNOSIS — I10 Essential (primary) hypertension: Secondary | ICD-10-CM | POA: Diagnosis not present

## 2023-02-10 DIAGNOSIS — Z0000 Encounter for general adult medical examination without abnormal findings: Secondary | ICD-10-CM | POA: Diagnosis not present

## 2023-02-10 DIAGNOSIS — E039 Hypothyroidism, unspecified: Secondary | ICD-10-CM | POA: Diagnosis not present

## 2023-08-10 DIAGNOSIS — E039 Hypothyroidism, unspecified: Secondary | ICD-10-CM | POA: Diagnosis not present

## 2023-08-10 DIAGNOSIS — R7303 Prediabetes: Secondary | ICD-10-CM | POA: Diagnosis not present

## 2023-08-10 DIAGNOSIS — E7800 Pure hypercholesterolemia, unspecified: Secondary | ICD-10-CM | POA: Diagnosis not present

## 2023-08-10 DIAGNOSIS — I10 Essential (primary) hypertension: Secondary | ICD-10-CM | POA: Diagnosis not present
